# Patient Record
Sex: Male | Born: 1945 | Hispanic: No | Marital: Married
Health system: Southern US, Community
[De-identification: ages and names within clinical notes are randomized; demographics above are authoritative.]

---

## 2005-09-23 ENCOUNTER — Emergency Department: Payer: Self-pay | Admitting: General Practice

## 2005-09-23 ENCOUNTER — Other Ambulatory Visit: Payer: Self-pay

## 2007-01-06 ENCOUNTER — Encounter: Payer: Self-pay | Admitting: Family Medicine

## 2007-01-28 ENCOUNTER — Encounter: Payer: Self-pay | Admitting: Family Medicine

## 2010-01-09 ENCOUNTER — Ambulatory Visit: Payer: Self-pay | Admitting: Gastroenterology

## 2010-01-13 LAB — PATHOLOGY REPORT

## 2010-09-15 ENCOUNTER — Emergency Department: Payer: Self-pay | Admitting: Unknown Physician Specialty

## 2011-04-30 ENCOUNTER — Ambulatory Visit: Payer: Self-pay | Admitting: Oncology

## 2011-05-26 ENCOUNTER — Inpatient Hospital Stay: Payer: Self-pay | Admitting: Internal Medicine

## 2011-05-26 LAB — URINALYSIS, COMPLETE
Blood: NEGATIVE
Leukocyte Esterase: NEGATIVE
Nitrite: NEGATIVE
Ph: 7 (ref 4.5–8.0)
Protein: NEGATIVE
RBC,UR: 3 /HPF (ref 0–5)
Squamous Epithelial: NONE SEEN

## 2011-05-26 LAB — COMPREHENSIVE METABOLIC PANEL
Albumin: 3.5 g/dL (ref 3.4–5.0)
Alkaline Phosphatase: 62 U/L (ref 50–136)
Anion Gap: 8 (ref 7–16)
Bilirubin,Total: 0.6 mg/dL (ref 0.2–1.0)
Calcium, Total: 9 mg/dL (ref 8.5–10.1)
Co2: 32 mmol/L (ref 21–32)
Creatinine: 0.89 mg/dL (ref 0.60–1.30)
EGFR (African American): >60
EGFR (Non-African Amer.): >60
Glucose: 142 mg/dL — ABNORMAL HIGH (ref 65–99)
Osmolality: 273 (ref 275–301)
SGOT(AST): 21 U/L (ref 15–37)
SGPT (ALT): 29 U/L

## 2011-05-26 LAB — PROTIME-INR
INR: 0.9
Prothrombin Time: 12.8 secs (ref 11.5–14.7)

## 2011-05-26 LAB — AMMONIA: Ammonia, Plasma: <25 mcmol/L (ref 11–32)

## 2011-05-26 LAB — CBC
HCT: 50.7 % (ref 40.0–52.0)
HGB: 16.6 g/dL (ref 13.0–18.0)
MCV: 84 fL (ref 80–100)
Platelet: 258 10*3/uL (ref 150–440)
RBC: 6.06 10*6/uL — ABNORMAL HIGH (ref 4.40–5.90)

## 2011-05-26 LAB — APTT: Activated PTT: 29.2 secs (ref 23.6–35.9)

## 2011-05-27 LAB — COMPREHENSIVE METABOLIC PANEL
Albumin: 3 g/dL — ABNORMAL LOW (ref 3.4–5.0)
Alkaline Phosphatase: 53 U/L (ref 50–136)
Anion Gap: 12 (ref 7–16)
BUN: 11 mg/dL (ref 7–18)
Bilirubin,Total: 0.5 mg/dL (ref 0.2–1.0)
Calcium, Total: 8.8 mg/dL (ref 8.5–10.1)
Chloride: 100 mmol/L (ref 98–107)
Creatinine: 0.77 mg/dL (ref 0.60–1.30)
EGFR (African American): >60
Glucose: 146 mg/dL — ABNORMAL HIGH (ref 65–99)
Osmolality: 281 (ref 275–301)
Potassium: 4.5 mmol/L (ref 3.5–5.1)
SGOT(AST): 18 U/L (ref 15–37)
SGPT (ALT): 21 U/L
Sodium: 140 mmol/L (ref 136–145)
Total Protein: 7.7 g/dL (ref 6.4–8.2)

## 2011-05-27 LAB — CBC WITH DIFFERENTIAL/PLATELET
Basophil #: 0 10*3/uL (ref 0.0–0.1)
Basophil %: 0.2 %
Eosinophil #: 0 10*3/uL (ref 0.0–0.7)
HCT: 49.2 % (ref 40.0–52.0)
HGB: 16 g/dL (ref 13.0–18.0)
Lymphocyte #: 0.8 10*3/uL — ABNORMAL LOW (ref 1.0–3.6)
Lymphocyte %: 13.9 %
MCH: 27.1 pg (ref 26.0–34.0)
MCHC: 32.5 g/dL (ref 32.0–36.0)
MCV: 84 fL (ref 80–100)
Neutrophil #: 4.8 10*3/uL (ref 1.4–6.5)
Platelet: 255 10*3/uL (ref 150–440)
WBC: 5.7 10*3/uL (ref 3.8–10.6)

## 2011-05-29 LAB — BASIC METABOLIC PANEL
Anion Gap: 10 (ref 7–16)
BUN: 13 mg/dL (ref 7–18)
Calcium, Total: 8.6 mg/dL (ref 8.5–10.1)
Chloride: 103 mmol/L (ref 98–107)
Co2: 29 mmol/L (ref 21–32)
Creatinine: 0.81 mg/dL (ref 0.60–1.30)
EGFR (African American): >60
EGFR (Non-African Amer.): >60
Glucose: 189 mg/dL — ABNORMAL HIGH (ref 65–99)
Osmolality: 288 (ref 275–301)
Potassium: 3.8 mmol/L (ref 3.5–5.1)
Sodium: 142 mmol/L (ref 136–145)

## 2011-05-29 LAB — CBC WITH DIFFERENTIAL/PLATELET
Basophil #: 0 10*3/uL (ref 0.0–0.1)
Eosinophil #: 0 10*3/uL (ref 0.0–0.7)
Eosinophil %: 0 %
HGB: 16.2 g/dL (ref 13.0–18.0)
Lymphocyte #: 0.5 10*3/uL — ABNORMAL LOW (ref 1.0–3.6)
Lymphocyte %: 5 %
MCH: 27.3 pg (ref 26.0–34.0)
MCHC: 32.5 g/dL (ref 32.0–36.0)
Monocyte #: 0.4 10*3/uL (ref 0.0–0.7)
Monocyte %: 4.6 %
Neutrophil %: 90.4 %
Platelet: 256 10*3/uL (ref 150–440)
RBC: 5.93 10*6/uL — ABNORMAL HIGH (ref 4.40–5.90)
RDW: 15.4 % — ABNORMAL HIGH (ref 11.5–14.5)
WBC: 9.1 10*3/uL (ref 3.8–10.6)

## 2011-06-11 ENCOUNTER — Emergency Department: Payer: Self-pay | Admitting: Emergency Medicine

## 2011-06-11 LAB — COMPREHENSIVE METABOLIC PANEL
Albumin: 2.5 g/dL — ABNORMAL LOW (ref 3.4–5.0)
Alkaline Phosphatase: 63 U/L (ref 50–136)
Anion Gap: 16 (ref 7–16)
Bilirubin,Total: 0.5 mg/dL (ref 0.2–1.0)
Creatinine: 1.06 mg/dL (ref 0.60–1.30)
Glucose: 334 mg/dL — ABNORMAL HIGH (ref 65–99)
Osmolality: 275 (ref 275–301)
Potassium: 4.3 mmol/L (ref 3.5–5.1)
SGOT(AST): 38 U/L — ABNORMAL HIGH (ref 15–37)
Sodium: 129 mmol/L — ABNORMAL LOW (ref 136–145)
Total Protein: 6.2 g/dL — ABNORMAL LOW (ref 6.4–8.2)

## 2011-06-11 LAB — CBC
HCT: 58.1 % — ABNORMAL HIGH (ref 40.0–52.0)
HGB: 18.5 g/dL — ABNORMAL HIGH (ref 13.0–18.0)
MCHC: 31.9 g/dL — ABNORMAL LOW (ref 32.0–36.0)
MCV: 84 fL (ref 80–100)
Platelet: 162 10*3/uL (ref 150–440)
RBC: 6.88 10*6/uL — ABNORMAL HIGH (ref 4.40–5.90)

## 2011-06-11 LAB — LIPASE, BLOOD: Lipase: 369 U/L (ref 73–393)

## 2011-06-12 LAB — URINALYSIS, COMPLETE
Bacteria: NONE SEEN
Bilirubin,UR: NEGATIVE
Blood: NEGATIVE
Glucose,UR: >=500 mg/dL (ref 0–75)
Leukocyte Esterase: NEGATIVE
Protein: NEGATIVE
Squamous Epithelial: NONE SEEN

## 2011-06-28 ENCOUNTER — Ambulatory Visit: Payer: Self-pay | Admitting: Oncology

## 2011-07-28 DEATH — deceased

## 2012-12-15 IMAGING — CT CT ABD-PELV W/ CM
1 of 3 series · 13 of 32 positions shown, 18 images · IV contrast (isovue)
Comparison: None

REASON FOR EXAM: (1) diffuse abd pain; (2) h/o lymphoma and brain ca;
NOTE: Nursing to [facility]
COMMENTS:   May transport without cardiac monitor

PROCEDURE:     CT  - CT ABDOMEN / PELVIS  W  - June 12, 2011  [DATE]
RESULT:     History: Abdominal pain
TECHNIQUE: Multiple axial images of the abdomen and pelvis were performed
from the lung bases to the pubic symphysis, with p.o. contrast and with 100
ml of Isovue 300 intravenous contrast.

[Series 2: 3mm soft tissue · axial · 0.68mm/px · z∈[-502,-66]mm · 13 of 163 slices shown, 18 images]
[im 9/163  soft-tissue]
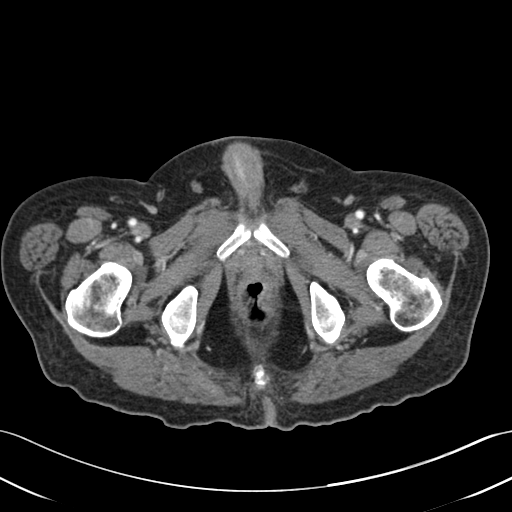
[im 9/163  bone]
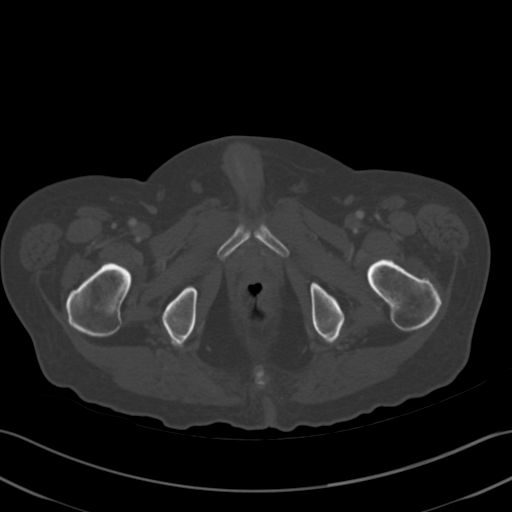
[im 25/163  soft-tissue]
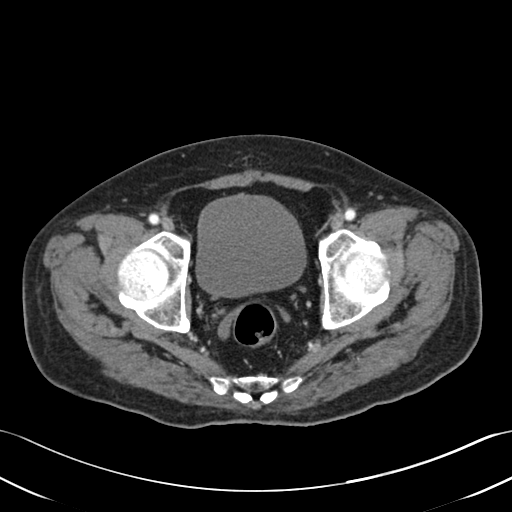
[im 33/163  soft-tissue]
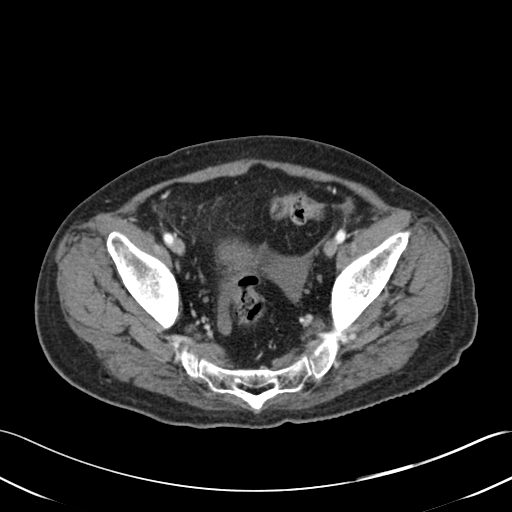
[im 49/163  soft-tissue]
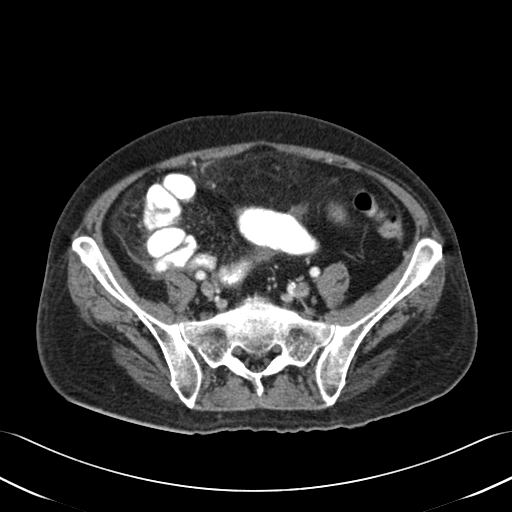
[im 65/163  soft-tissue]
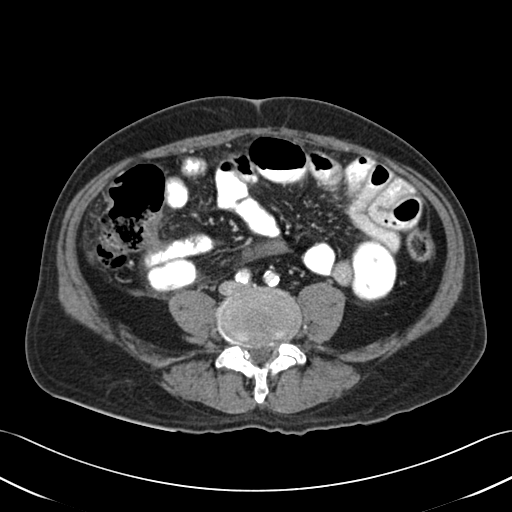
[im 73/163  soft-tissue]
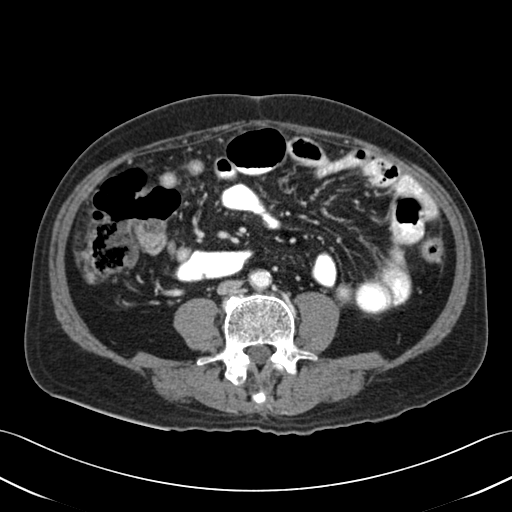
[im 90/163  soft-tissue]
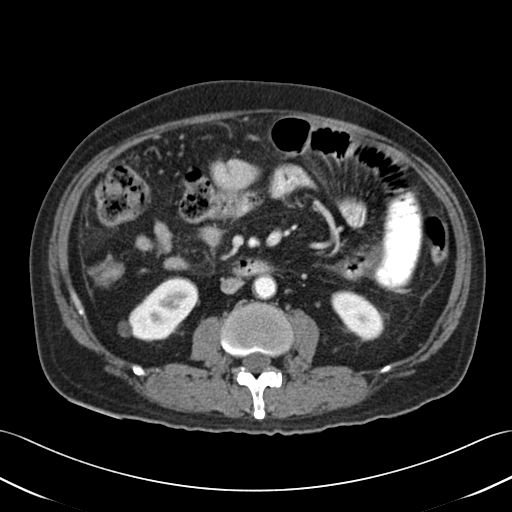
[im 98/163  soft-tissue]
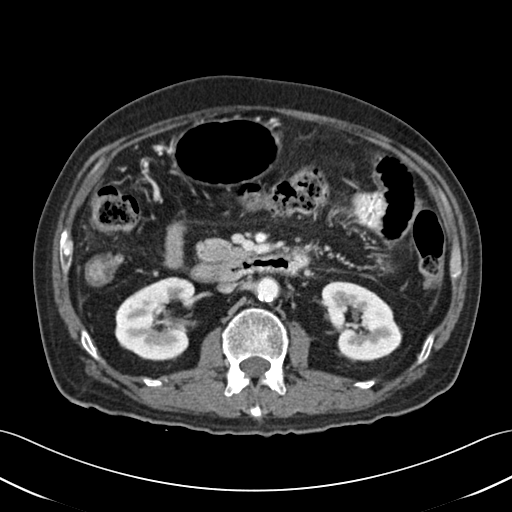
[im 114/163  soft-tissue]
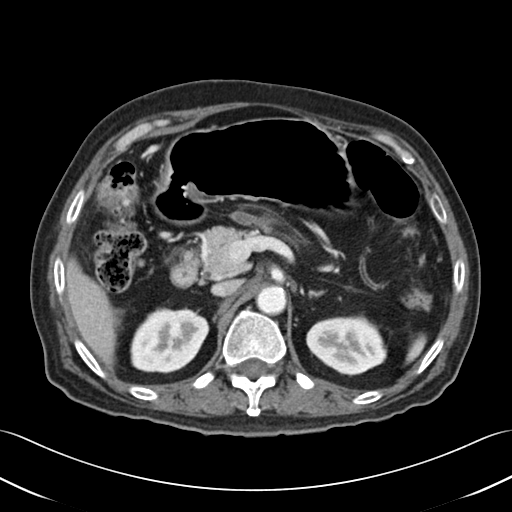
[im 114/163  bone]
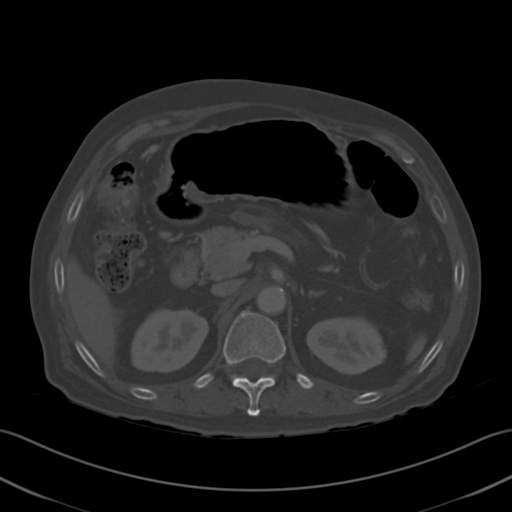
[im 130/163  soft-tissue]
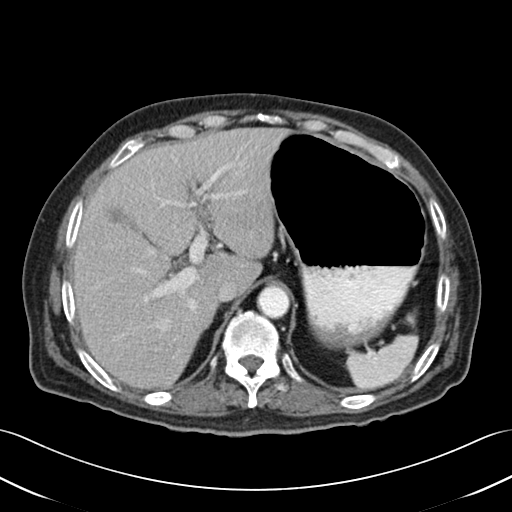
[im 130/163  lung]
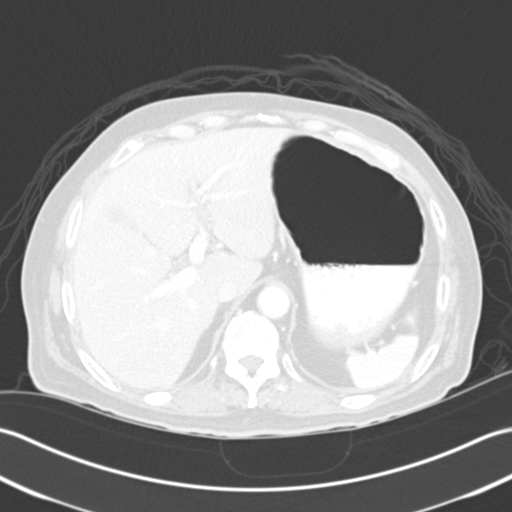
[im 138/163  soft-tissue]
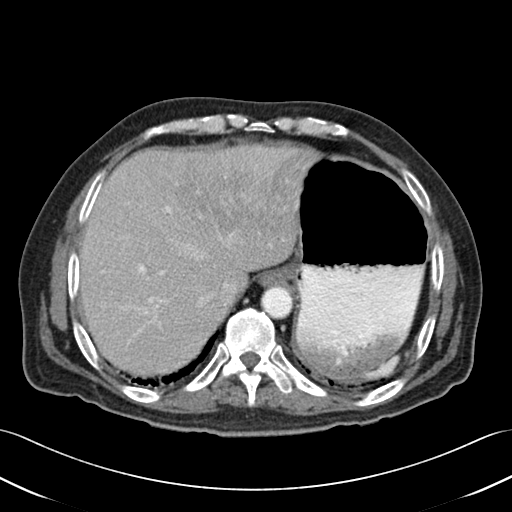
[im 138/163  lung]
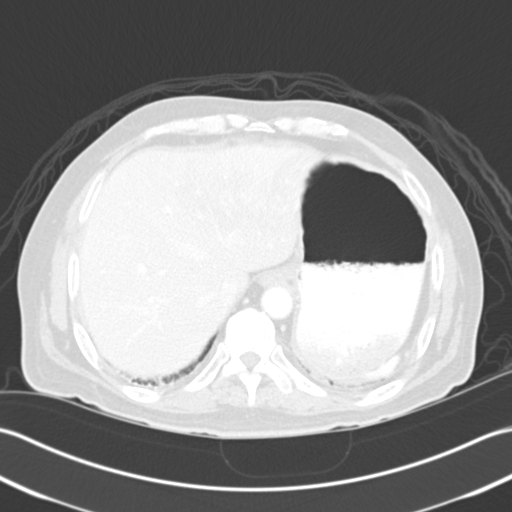
[im 146/163  lung]
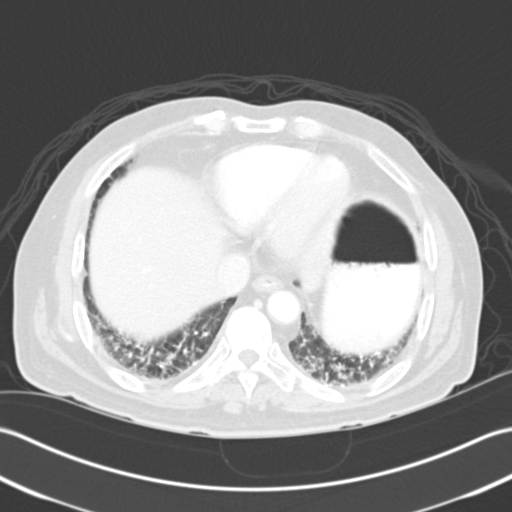
[im 154/163  soft-tissue]
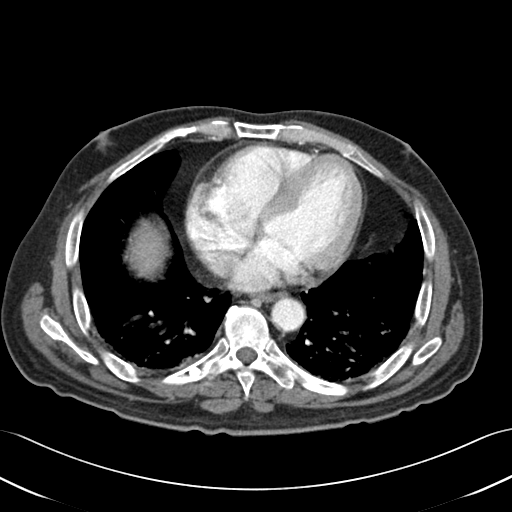
[im 154/163  lung]
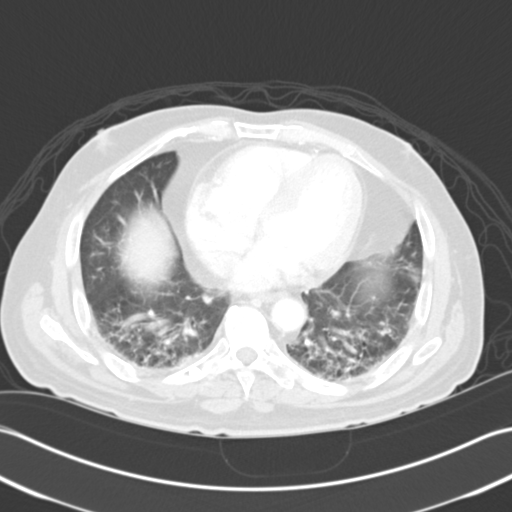

[13 of 32 positions shown; findings below may reference images not displayed]

FINDINGS: The lung bases are clear. There is no pneumothorax. The heart size is
normal.

There is heterogeneity of the left hepatic lobe . The vessels course
normally through the area of heterogeneity without displacement. There is no
intrahepatic or extrahepatic biliary ductal dilatation. There are
cholelithiasis.. The spleen demonstrates no focal abnormality. The kidneys,
adrenal glands, and pancreas are normal. The bladder is unremarkable.

There are multiple dilated loops of small bowel measuring up to 3.5 cm in
diameter. There is mild bowel wall thickening. There is diverticulosis of
the ascending and descending colon. There is a hazy inflammatory changes
surrounding the ascending colon which may reflect mild diverticulitis. There
is no pneumoperitoneum, pneumatosis, or portal venous gas. There is a small
amount of pelvic free fluid. There is no lymphadenopathy.

The abdominal aorta is normal in caliber with atherosclerosis.

The osseous structures are unremarkable.
IMPRESSION: 1. There are multiple dilated loops of small bowel measuring up to 3.5 cm in
diameter with mild bowel wall thickening concerning for enteritis secondary
to an infectious or inflammatory etiology. There is likely associated ileus.

2. There is diverticulosis of the ascending and descending colon. There is a
hazy inflammatory changes surrounding the ascending colon which may reflect
mild diverticulitis.

3. Diffuse heterogeneity of the left hepatic lobe right with findings
suggestive of focal fatty infiltration. Recommend further evaluation with a
multiphasic MRI.

## 2014-07-21 NOTE — H&P (Signed)
PATIENT NAME:  Chris Crane, Chris Crane MR#:  [id number] DATE OF BIRTH:  [birth year]  DATE OF ADMISSION:  05/26/2011  REFERRING PHYSICIAN: Dr. Lorenso CourierPowers    FAMILY PHYSICIAN: Dr. Burnett ShengHedrick   REASON FOR ADMISSION: Altered mental status.   HISTORY OF PRESENT ILLNESS: The patient is a 69 year old male with a history of hypertension, hyperlipidemia, as well as brain tumor treated at [facility] approximately 5 to 6 years ago. He had chemo and radiation at that time. This was complicated by MRSA pneumonia as well as seizures. He has been on Keppra since then. No recent seizures. He presents now with a 1 to 2 day history of confusion and altered mental status. The family denies fevers. No nausea or vomiting. No apparent headaches. In the Emergency Room, the patient was confused and lethargic. He is now admitted for further evaluation.   PAST MEDICAL HISTORY:  1. History of brain tumor, questionably lymphoma.  2. History of MRSA.  3. History of pneumonia.  4. Benign hypertension.  5. Hyperlipidemia.  6. Gastroesophageal reflux disease.   MEDICATIONS:  1. Prilosec 20 mg p.o. daily.  2. Zestril 10 mg p.o. daily. 3. Keppra 500 mg p.o. b.i.d.  4. Hydrocortisone 20 mg p.o. q.a.m. and 10 mg p.o. q.p.m.  5. Crestor 10 mg p.o. at bedtime.  6. Aspirin 81 mg p.o. daily.   ALLERGIES: No known drug allergies.   SOCIAL HISTORY: Negative for alcohol or tobacco abuse.   FAMILY HISTORY: Positive for hypertension but otherwise unremarkable.   REVIEW OF SYSTEMS: Unable to obtain from patient.   PHYSICAL EXAMINATION:   GENERAL: The patient is lethargic but in no acute distress.   VITAL SIGNS: Vital signs are remarkable for a blood pressure of 133/89 with a heart rate of 90 and a respiratory rate of 18. He is afebrile.   HEENT: Pupils are equal and reactive. Oropharynx is clear. Sclerae are nonicteric. Conjunctivae are clear.   NECK: Supple without JVD. No adenopathy or thyromegaly is noted.   LUNGS: Essentially  clear to auscultation and percussion. No wheezes or rhonchi. No dullness.   CARDIAC: Regular rate and rhythm with normal S1, S2. No significant rubs, murmurs, or gallops. PMI is nondisplaced. Chest wall is nontender.   ABDOMEN: Soft, nontender with normoactive bowel sounds. No organomegaly or masses were appreciated. No hernias or bruits were noted.   EXTREMITIES: Without clubbing, cyanosis, edema. Pulses were 2+ bilaterally.   SKIN: Warm and dry without rash or lesions.   NEUROLOGIC: Neurologic exam revealed lip smacking. The patient was essentially aphasic. Cranial nerves II through XII were grossly intact. Deep tendon reflexes were symmetric. Motor and sensory exams nonfocal.   PSYCH: Nonverbal and would not respond to questions.   LABORATORY DATA: EKG revealed sinus rhythm with no acute ischemic changes. Chest x-ray was essentially unremarkable. Head CT revealed a left frontal lobe mass with surrounding edema. Lactic acid was normal at 1.2. PO2 was 76 on room air. TSH was within normal limits. White count was 8.3 with a hemoglobin of 16.6 with a glucose of 142 and a BUN of 13 with a creatinine of 0.89 and a sodium of 135 with a potassium of 3.7.   ASSESSMENT:  1. Altered mental status with encephalopathy.  2. Recurrent brain tumor with cerebral edema.  3. Hyponatremia.  4. Benign hypertension.  5. Hyperlipidemia.  6. History of MRSA.   PLAN:  1. [facility] He will be admitted here to the Oncology floor with IV  Decadron and empiric IV antibiotics.  2. Will perform neuro checks q.4 hours. There is no neurologist available at this time for consultation, although I have spoken with Dr. Kemper Durie over the phone who agrees with the use of IV Decadron.  3. Will consult Dr. Doylene Canning urgently for further evaluation. May require Radiation Oncology consult but will wait on Dr. Doylene Canning to decide this.  4. Will follow his sugars while on steroids.  5. Continue his  medications for hypertension and hyperlipidemia.  6. Will continue Keppra for seizure prophylaxis.  7. Further treatment and evaluation will depend upon the patient's progress.  8. At this time, the patient will be kept n.p.o. except for meds and ice chips.   TOTAL TIME SPENT ON THIS PATIENT: 50 minutes.   ____________________________ Duane Lope Judithann Sheen, MD jds:drc D: 05/26/2011 16:20:12 ET T: 05/26/2011 16:47:16 ET JOB#: [id number]  cc: Duane Lope. Judithann Sheen, MD, <Dictator> Rhona Leavens. Burnett Sheng, MD Keanon Bevins Rodena Medin MD ELECTRONICALLY SIGNED 05/28/2011 19:26

## 2014-07-21 NOTE — Discharge Summary (Signed)
PATIENT NAME:  Chris GoltzCHINDAVONG, Jarrette MR#:  [id number] DATE OF BIRTH:  Feb 04, 1946  DATE OF ADMISSION:  05/26/2011 DATE OF DISCHARGE:  05/30/2011   ADMITTING DIAGNOSIS: Altered mental status.   DISCHARGE DIAGNOSES:  1. Acute encephalopathy due to brain mass with edema, now mental status significantly improved.  2. Brain mass with previous history of brain tumor, questionable lymphoma. [facility], is on chronic hydrocortisone and Keppra therapy.  3. History of MRSA.  4. History of pneumonia.  5. Hypertension.  6. Hyperlipidemia.  7. Gastroesophageal reflux disease.   PERTINENT LABORATORY EVALUATIONS: EKG showed sinus rhythm with no acute ischemic changes. Chest x-ray was unremarkable. Head CT scan showed left frontal lobe mass with surrounding edema. Lactic acid 1.2. TSH was normal. WBC 8.3, hemoglobin 16.6, creatinine 0.89, sodium 135, potassium 3.7. 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 Lymphoma could also present in this fashion.   CONSULTANTS:  Dr. Doylene Canninghoksi  [facility] was contacted by Dr. Lafayette DragonFirozvi, Dr. Cyndra Numbersajan    HOSPITAL COURSE: Please see history and physical done by the admitting physician. The patient is a 69 year old Asian male with history of hypertension, hyperlipidemia, as well as brain tumor who has had radiation therapy at that time. He also has been on chronic Keppra and hydrocortisone for the brain mass. He presented with altered mental status with 1 to 2 days of confusion. Due to this he was seen in the ED. In the ED he was noted to have a CT scan which showed the mass and brain edema. The patient was admitted and was started on treatment with IV Decadron. Initially it was attempted to transfer the patient to [facility] directly from the ED, however, they had no beds so he was admitted for control of his vasogenic edema. Since the patient had received all of his  care previously, family wanted [facility]. Again, attempts were made on Saturday to transfer him. There were no beds so an urgent appointment for him has been [facility] tomorrow for 9 a.m. The patient's mental status has significantly improved. Currently he is doing much better. He will need further evaluation and [facility]. Currently he is stable for discharge.   DISCHARGE MEDICATIONS:  1. Crestor 10 daily.  2. Lisinopril 10 daily.  3. Omeprazole 20 daily.  4. Keppra 500 p.o. b.i.d.  5. Decadron 4 mg p.o. q.6 hours.   DISCHARGE ACTIVITY: As tolerated.   DIET: Low sodium.   FOLLOW-UP:  1. [facility].  2. Follow-up with Dr. Burnett ShengHedrick in 1 to 2 weeks.   DO NOT TAKE: The patient is told to stop taking the hydrocortisone until further recommendations per Neurosurgery.   TIME SPENT: 35 minutes.   ____________________________ Lacie ScottsShreyang H. Allena KatzPatel, MD shp:drc D: 05/30/2011 11:51:31 ET T: 05/30/2011 14:48:42 ET JOB#: [id number]  cc: Suzette Flagler H. Allena KatzPatel, MD, <Dictator> Rhona LeavensJames F. Burnett ShengHedrick, MD Charise CarwinSHREYANG H Naleah Kofoed MD ELECTRONICALLY SIGNED 06/04/2011 7:53

## 2014-07-21 NOTE — Consult Note (Signed)
History of Present Illness:   Reason for Consult HISTORY OF PRESENT ILLNESS: The patient is a 69 year old male with a history of hypertension, hyperlipidemia, as well as brain tumor treated at [facility] approximately 5 to 6 years ago. He had chemo and radiation at that time. This was complicated by MRSA pneumonia as well as seizures. He has been on Keppra since then. No recent seizures. He presents now with a 1 to 2 day history of confusion and altered mental status. The family denies fevers. No nausea or vomiting. No apparent headaches. In the Emergency Room, the patient was confused and lethargic. He is now admitted for further evaluation I was asked to evaluate this patient because of abnormal CT scan and MRI scan.  According to family patient is somewhat more alert today than before.  Complaints of nausea vomiting and headache   PFSH:   Comments does not smoke.  Does not drink. No family history of colorectal cancer, breast cancer, or ovarian cancer.    Social History noncontributory    Additional Past Medical and Surgical History PAST MEDICAL HISTORY:  1. History of brain tumor, questionably lymphoma.  2. History of MRSA.  3. History of pneumonia.  4. Benign hypertension.  5. Hyperlipidemia.  6. Gastroesophageal reflux disease.   Review of Systems:   Performance Status (ECOG) 2    HEENT no complaints    Lungs no complaints    Cardiac no complaints    GI nausea/vomiting    GU no complaints    Review of Systems   Family.patient in the emergency room with altered mental status  NURSING NOTES: **Vital Signs.:   28-Feb-13 17:12    Vital Signs Type: Q 4hr    Temperature Temperature (F): 97.4    Celsius: 36.3    Temperature Source: oral    Pulse Pulse: 82    Pulse source: per Dinamap    Respirations Respirations: 18    Systolic BP Systolic BP: 117    Diastolic BP (mmHg) Diastolic BP (mmHg): 81    Mean BP: 93    BP Source: Dinamap    Pulse Ox % Pulse Ox %: 100     Pulse Ox Activity Level: At rest    Oxygen Delivery: Room Air/ 21 %   Physical Exam:   Physical Exam Gen. status: Patient is lying in the bed poorly responsive  Also language issue LYMPHATICS:   No cervical, axillary, or inguinal lymphadenopathy CARDIOVASCULAR:   Regular rate and rhythm. S1 and S2 without murmur, gallop or rub. CHEST:   The chest is clear to auscultation.  Breath sounds symmetrical. No rhonchi, rales, or wheezes. ABDOMINAL:   Positive bowel sounds, nontender, nondistended, soft.  No hepatomegaly, no splenomegalySKIN:   No rashes, ulcers, or lesions  Neurological system: Higher functions patient is somewhat confused and disoriented to time and place.  Deep tendon reflexes are equal.   power   and tone     is equal on both sides.  Cranial nerves are intact     HTN:    Hyperlipidemia:    GERD - Esophageal Reflux:    MRSA greater than 6 months ago: 4-5 years prior after brain surgery.   Brain cancer:    Craniotomy:    No Known Allergies:   No Known Allergies:     Crestor 10 mg oral tablet: 1 tab(s) orally once a day (at bedtime), Active, 0, None   aspirin 81 mg oral tablet: 1 tab(s) orally once a day, Active, 0, None  hydrocortisone 10 mg oral tablet: 1 tab(s) orally 3 times a day      (2 tabs qam/1 tab qhs), Active, 0, None   lisinopril 10 mg oral tablet: Active, 0, None   omeprazole 20 mg oral delayed release capsule: Active, 0, None   levetiracetam 500 mg oral tablet: Active, 0, None    28-Feb-13 14:58, MRI Brain  With/Without Contrast   Assessment and Plan:  Impression:   1.Abnormal MRI scan.  Suggestive of primary or metastatic disease to the brain.  Patient is a previous history of brain malignancy for which we do not have any records available.  Son is not available to you as history.  [facility] at the time of my examination.  Patient was treated few years ago with chemotherapy and radiation therapy and last  year on examination was found to be in remission of brain malignancy of unknown type.  I had prolonged discussion with family as well as with Dr. Tonny BranchFirozi .  If we can get [facility] in the family decides that did not want to get local care I can help to arrange for that evaluation as well as [facility]surgical [facility] if needed.  I will check with the son and pending condition tomorrow.   Continue IV steroid.  Continue Keppra.  Electronic Signatures: Laddie Aquashoksi, Nevaan Bunton K (MD)  (Signed [id number]-Feb-13 18:53)  Authored: HISTORY OF PRESENT ILLNESS, PFSH, ROS, NURSING NOTES, PE, PAST MEDICAL HISTORY, ALLERGIES, HOME MEDICATIONS, OTHER RESULTS, ASSESSMENT AND PLAN   Last Updated: 28-Feb-13 18:53 by Laddie Aquashoksi, Cammy Sanjurjo K (MD)
